# Patient Record
Sex: Male | Born: 1950 | Race: White | Hispanic: No | Marital: Married
Health system: Southern US, Community
[De-identification: ages and names within clinical notes are randomized; demographics above are authoritative.]

---

## 2011-02-11 ENCOUNTER — Ambulatory Visit: Payer: Self-pay | Admitting: Internal Medicine

## 2011-04-15 ENCOUNTER — Ambulatory Visit: Payer: Self-pay | Admitting: Gastroenterology

## 2011-04-17 LAB — PATHOLOGY REPORT

## 2012-05-27 IMAGING — US ABDOMEN ULTRASOUND
1 series · 17 of 25 positions shown · non-contrast
Comparison: none

REASON FOR EXAM: RUQ pain
COMMENTS:

PROCEDURE:     US  - US ABDOMEN GENERAL SURVEY  - February 11, 2011  [DATE]
RESULT:     Comparison: None
TECHNIQUE: Multiple gray-scale and color-flow Doppler images of the abdomen
are presented for review.

[Series 1: abdomen ultrasound · 17 of 69 slices shown]
[im 1/69]
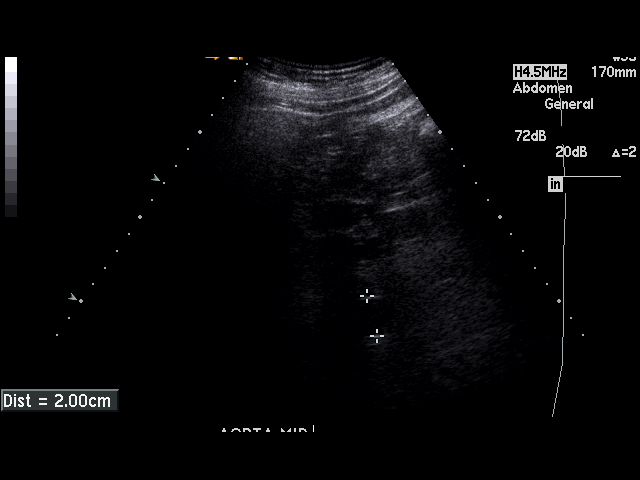
[im 6/69]
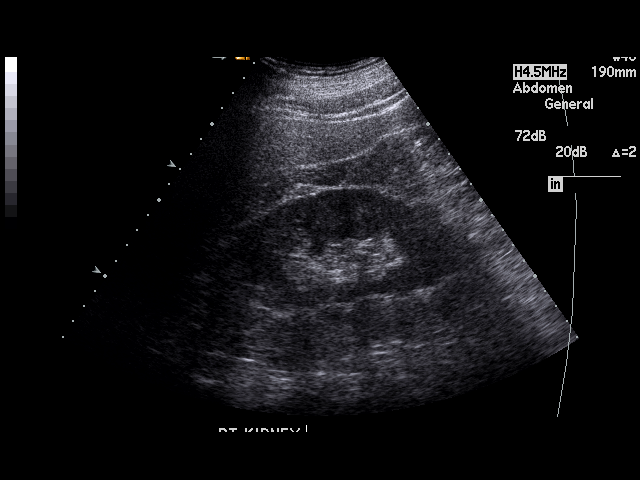
[im 9/69]
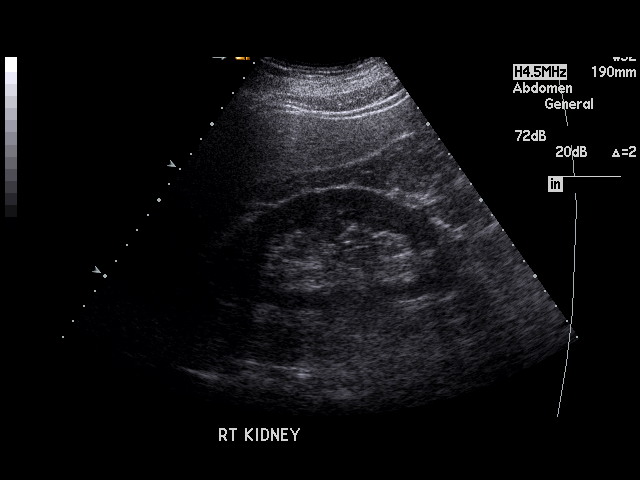
[im 15/69]
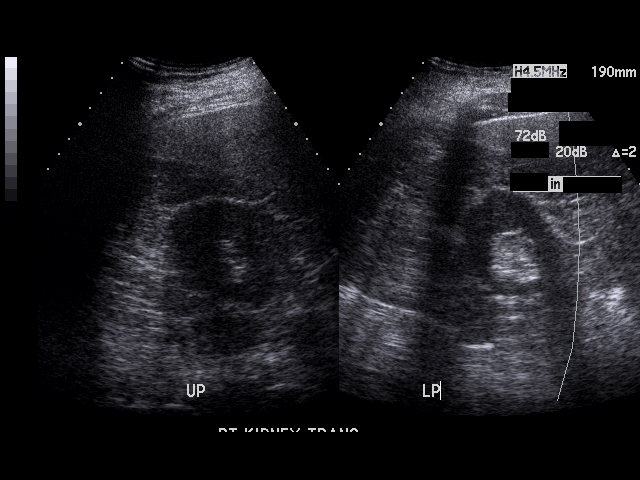
[im 18/69]
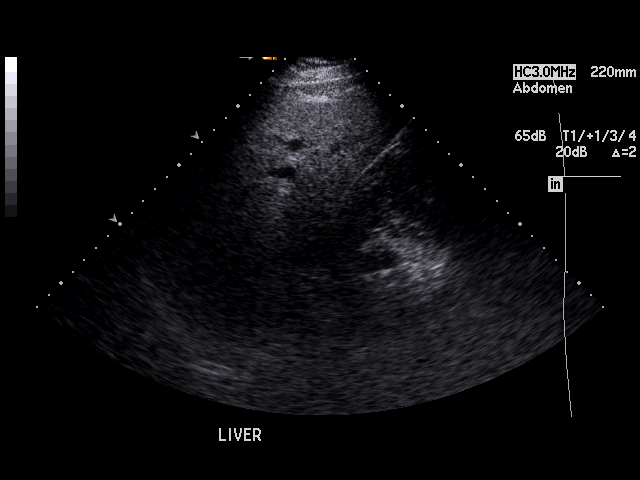
[im 23/69]
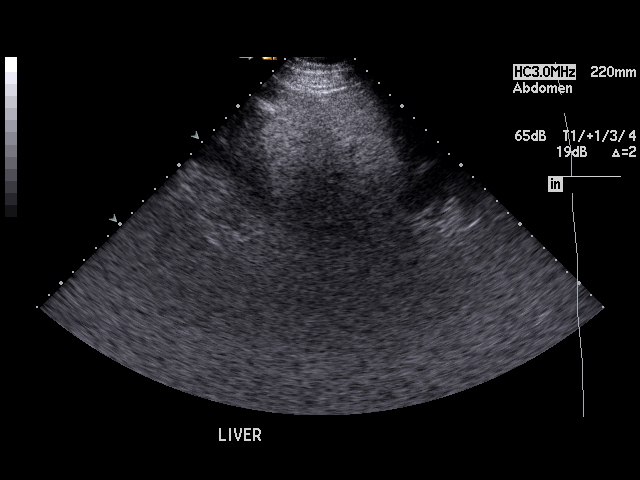
[im 26/69]
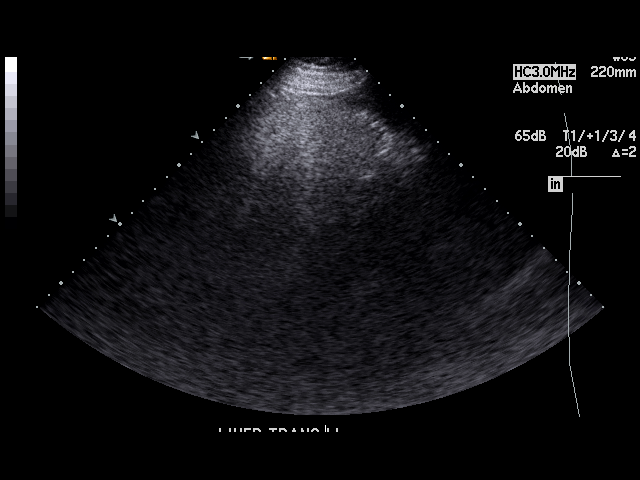
[im 32/69]
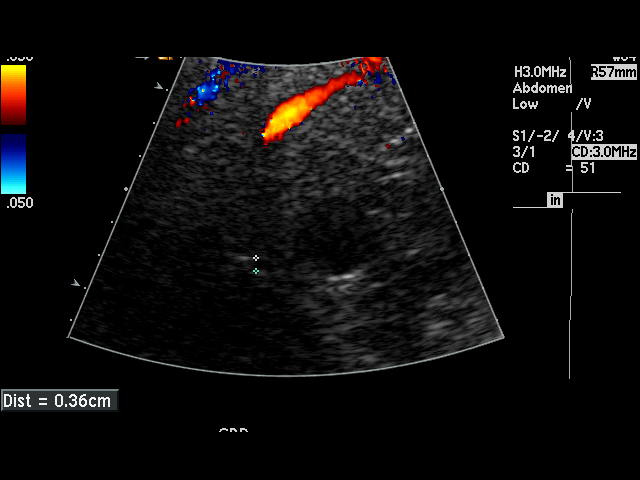
[im 35/69]
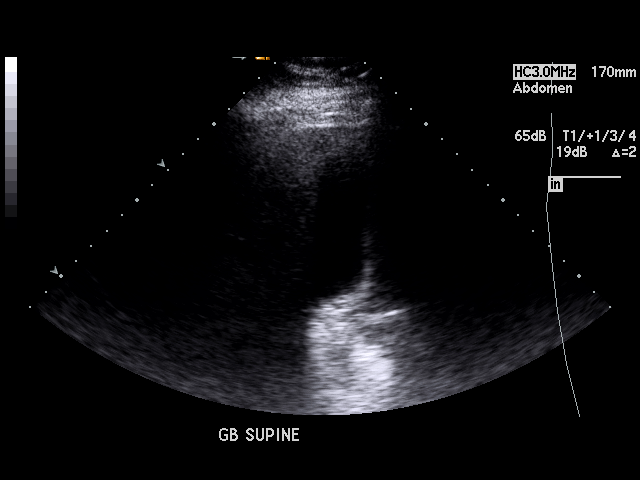
[im 37/69]
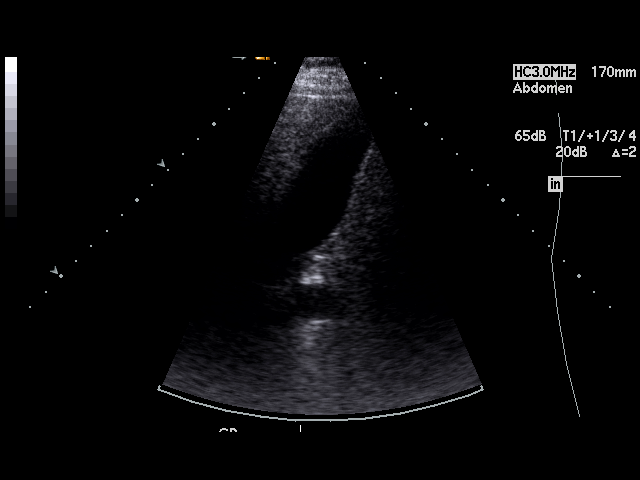
[im 43/69]
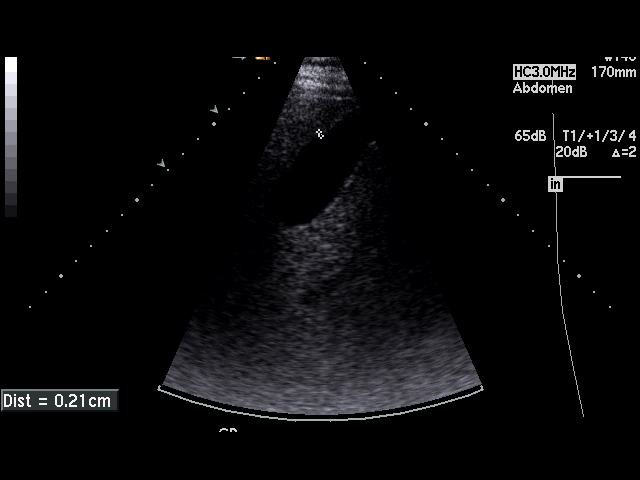
[im 46/69]
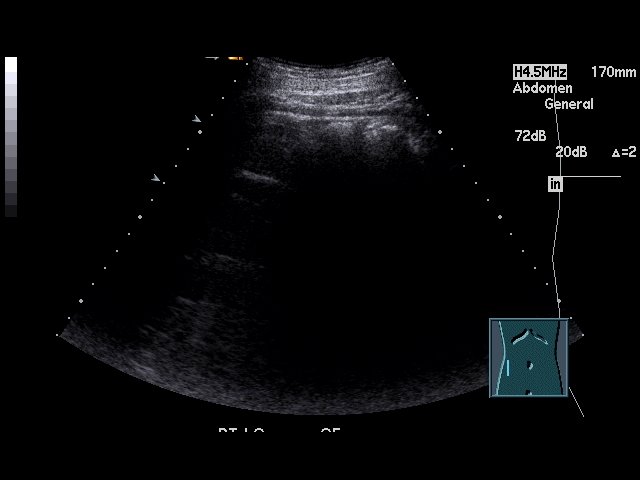
[im 52/69]
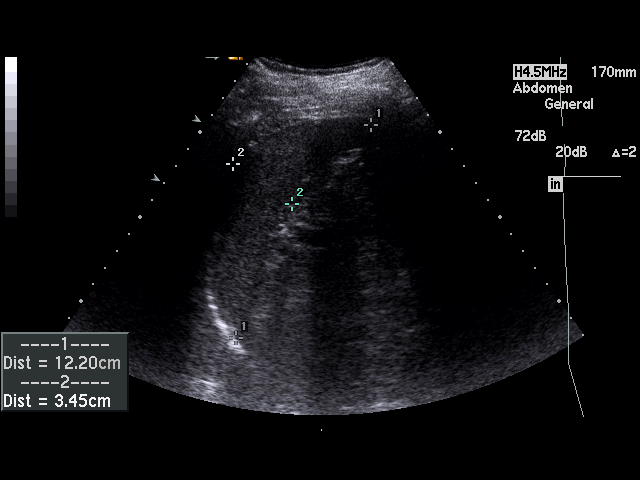
[im 54/69]
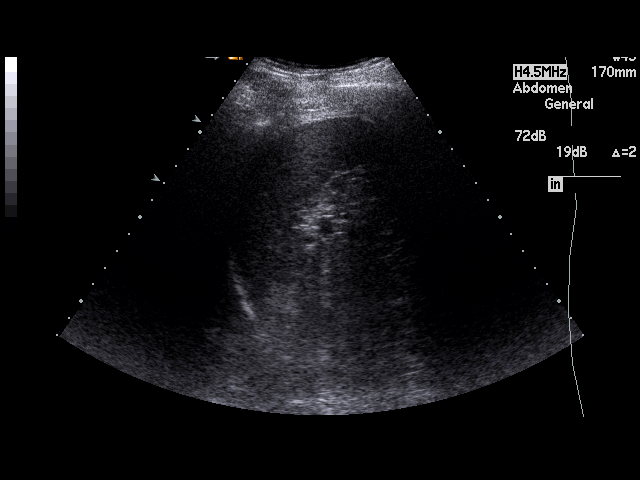
[im 60/69]
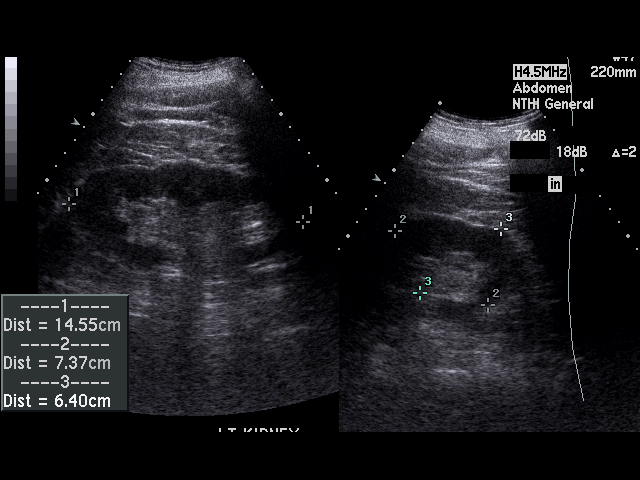
[im 63/69]
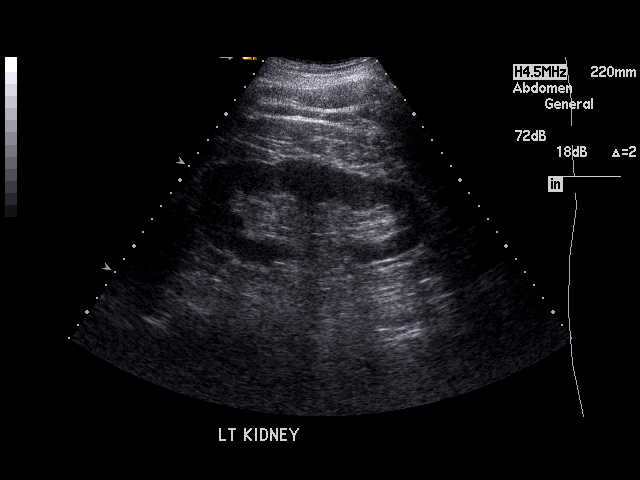
[im 69/69]
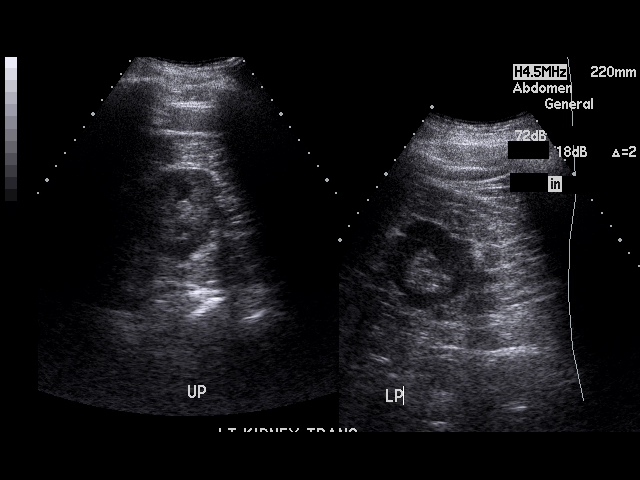

[17 of 25 positions shown; findings below may reference images not displayed]

FINDINGS: The liver is diffusely increased in echogenicity as can be seen with hepatic
steatosis. The liver is without evidence of a focal hepatic lesion.

There is no cholelithiasis or biliary sludge. There is no intra- or
extrahepatic biliary ductal dilatation. The common duct measures 3.6 mm in
maximal diameter. There is no gallbladder wall thickening, pericholecystic
fluid, or sonographic Murphy's sign.

The pancreas is not visualized secondary to overlying bowel gas. The spleen
is unremarkable. Bilateral kidneys are normal in echogenicity and size. The
right kidney measures 14.3 x 7.2 x 6.3 cm. The left kidney measures 14.6 x
7.4 x 6.4 cm. There are no renal calculi or hydronephrosis. The visualized
portions of the abdominal aorta is unremarkable. The IVC is not visualized
secondary to overlying bowel gas.
IMPRESSION: Nonvisualized pancreas secondary to overlying bowel gas.

Hepatic steatosis.

No cholelithiasis or sonographic evidence of acute cholecystitis.

## 2012-07-30 ENCOUNTER — Observation Stay: Payer: Self-pay | Admitting: Student

## 2012-07-30 LAB — APTT: Activated PTT: 91.9 secs — ABNORMAL HIGH (ref 23.6–35.9)

## 2012-07-30 LAB — COMPREHENSIVE METABOLIC PANEL
Albumin: 4.3 g/dL (ref 3.4–5.0)
Anion Gap: 11 (ref 7–16)
BUN: 12 mg/dL (ref 7–18)
Calcium, Total: 9.3 mg/dL (ref 8.5–10.1)
Co2: 23 mmol/L (ref 21–32)
EGFR (African American): >60
EGFR (Non-African Amer.): >60
Glucose: 107 mg/dL — ABNORMAL HIGH (ref 65–99)
Potassium: 4.4 mmol/L (ref 3.5–5.1)
SGOT(AST): 28 U/L (ref 15–37)
SGPT (ALT): 37 U/L (ref 12–78)
Sodium: 139 mmol/L (ref 136–145)

## 2012-07-30 LAB — PROTIME-INR
INR: 1
Prothrombin Time: 13.9 secs (ref 11.5–14.7)

## 2012-07-30 LAB — TROPONIN I: Troponin-I: <0.02 ng/mL

## 2012-07-30 LAB — CBC
HCT: 45.2 % (ref 40.0–52.0)
HGB: 15.8 g/dL (ref 13.0–18.0)
MCH: 33.3 pg (ref 26.0–34.0)
MCV: 95 fL (ref 80–100)
RDW: 13.3 % (ref 11.5–14.5)
WBC: 6.5 10*3/uL (ref 3.8–10.6)

## 2012-07-30 LAB — CK TOTAL AND CKMB (NOT AT ARMC)
CK, Total: 142 U/L (ref 35–232)
CK-MB: 3.9 ng/mL — ABNORMAL HIGH (ref 0.5–3.6)

## 2012-07-31 LAB — BASIC METABOLIC PANEL
BUN: 13 mg/dL (ref 7–18)
Chloride: 106 mmol/L (ref 98–107)
Creatinine: 0.88 mg/dL (ref 0.60–1.30)
EGFR (African American): >60
EGFR (Non-African Amer.): >60
Glucose: 164 mg/dL — ABNORMAL HIGH (ref 65–99)
Osmolality: 285 (ref 275–301)
Potassium: 4 mmol/L (ref 3.5–5.1)
Sodium: 141 mmol/L (ref 136–145)

## 2012-07-31 LAB — CK TOTAL AND CKMB (NOT AT ARMC)
CK, Total: 111 U/L (ref 35–232)
CK-MB: 2.9 ng/mL (ref 0.5–3.6)

## 2012-07-31 LAB — CBC WITH DIFFERENTIAL/PLATELET
Basophil #: 0 10*3/uL (ref 0.0–0.1)
Eosinophil #: 0.1 10*3/uL (ref 0.0–0.7)
HCT: 42.7 % (ref 40.0–52.0)
HGB: 15.1 g/dL (ref 13.0–18.0)
Lymphocyte #: 1.8 10*3/uL (ref 1.0–3.6)
Lymphocyte %: 29.1 %
MCH: 33.7 pg (ref 26.0–34.0)
MCHC: 35.3 g/dL (ref 32.0–36.0)
MCV: 96 fL (ref 80–100)
Neutrophil #: 3.9 10*3/uL (ref 1.4–6.5)
RDW: 13.4 % (ref 11.5–14.5)

## 2012-07-31 LAB — MAGNESIUM: Magnesium: 1.8 mg/dL

## 2012-07-31 LAB — HEMOGLOBIN A1C: Hemoglobin A1C: 7.1 % — ABNORMAL HIGH (ref 4.2–6.3)

## 2012-07-31 LAB — TROPONIN I: Troponin-I: <0.02 ng/mL

## 2012-07-31 LAB — LIPID PANEL
HDL Cholesterol: 32 mg/dL — ABNORMAL LOW (ref 40–60)
VLDL Cholesterol, Calc: 54 mg/dL — ABNORMAL HIGH (ref 5–40)

## 2013-11-13 IMAGING — CR DG CHEST 1V PORT
1 series · 1 of 1 positions shown · non-contrast
Comparison: none

REASON FOR EXAM: Chest Pain
COMMENTS:

PROCEDURE:     DXR - DXR PORTABLE CHEST SINGLE VIEW  - July 30, 2012 [DATE]
RESULT:     There is no previous study for comparison. Hypoinflation is
present. Cardiac silhouette is normal. Basilar atelectasis is present.

[ap]
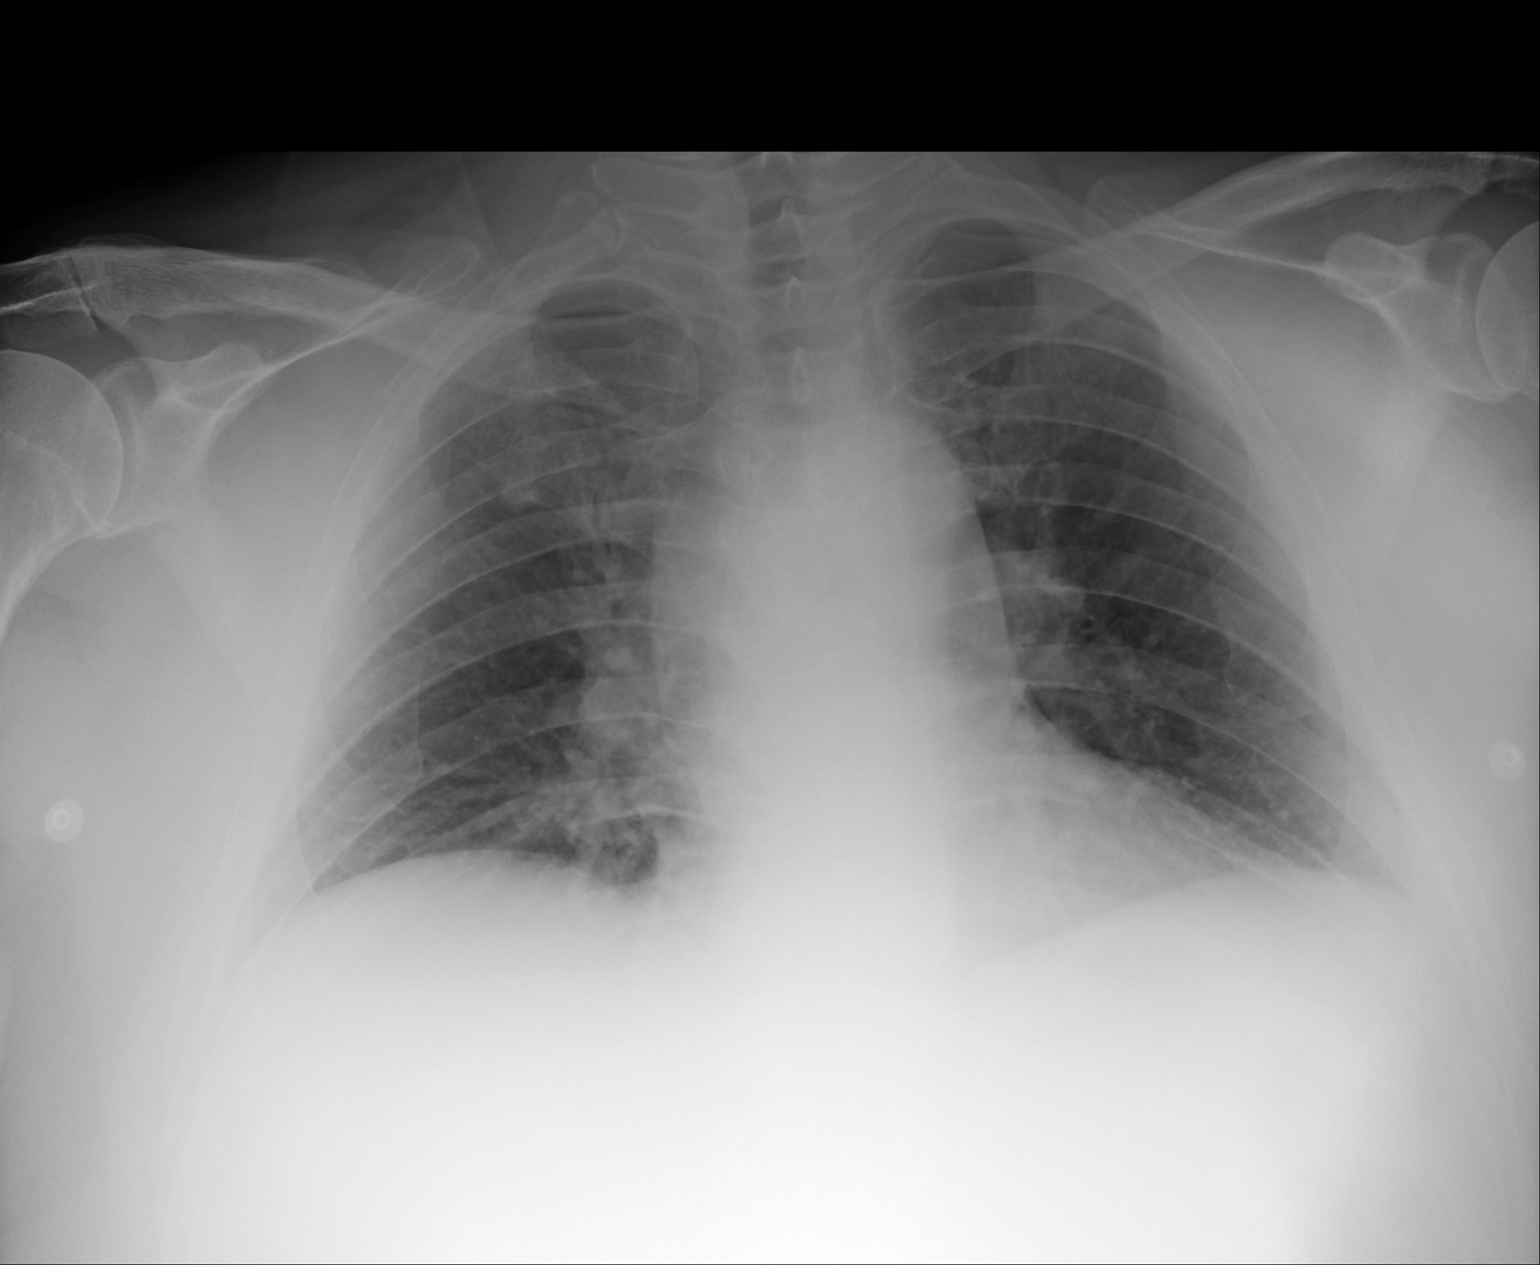

[1 of 1 positions shown; findings below may reference images not displayed]

IMPRESSION: Hypoinflation basilar atelectasis. No acute abnormality
evident.

[REDACTED]

## 2015-02-28 NOTE — Discharge Summary (Signed)
PATIENT NAME:  Alex Duran, Violet E MR#:  [id number] DATE OF BIRTH:  May 06, 1951  DATE OF ADMISSION:  07/30/2012 DATE OF DISCHARGE:  08/01/2012  REASON FOR ADMISSION: Chest pain.   HISTORY OF PRESENT ILLNESS: Please see the dictated HPI done by Dr. Jacques NavyAhmadzia on 07/30/2012.   PAST MEDICAL HISTORY:  1. Benign hypertension.  2. Type 2 diabetes.  3. Status post knee surgery.  4. Obesity.   MEDICATIONS ON ADMISSION: Please see admission note.   ALLERGIES: No known drug allergies.   SOCIAL HISTORY, FAMILY HISTORY, AND REVIEW OF SYSTEMS: As per admission note.   PHYSICAL EXAM:   GENERAL: The patient was in no acute distress.   VITAL SIGNS: Vital signs were stable, and he was afebrile.   HEENT: Unremarkable.   NECK: Supple without JVD.   LUNGS: Clear.   CARDIAC EXAM: Regular rate and rhythm. Normal S1 and S2.   ABDOMEN: Soft and nontender.   EXTREMITIES: Without edema.   NEUROLOGIC EXAM: Grossly nonfocal.   HOSPITAL COURSE: The patient was admitted with multiple risk factors and chest pain. He ruled out for an MI by serial cardiac enzymes. He was seen in consultation by Cardiology. The patient underwent stress Myoview testing on 07/31/2012 which was negative for ischemia. He had no further chest pain during his hospitalization. This morning, he is stable without complaints and is ready for discharge.   DISCHARGE DIAGNOSES:  1. Noncardiac chest pain.  2. Gastroesophageal reflux disease.  3. Presumed esophageal spasm.  4. Benign hypertension.  5. Type 2 diabetes.   DISCHARGE MEDICATIONS:  1. Aspirin 81 mg p.o. daily.  2. Omeprazole 20 mg p.o. b.i.d.  3. Imdur 30 mg p.o. daily.  4. Lopressor 25 mg p.o. b.i.d.  5. Allopurinol 300 mg p.o. daily.  6. Glyburide 5 mg p.o. daily.  7. Enalapril 40 mg p.o. daily.  8. Metformin 1000 mg p.o. daily.  9. Victoza as directed.   FOLLOWUP PLANS AND APPOINTMENTS: The patient was discharged on a low-sodium, carbohydrate-controlled diet. He  will follow up with Dr. Dareen PianoAnderson in 1 to 2 weeks, sooner if needed.   ____________________________ Duane LopeJeffrey D. Judithann SheenSparks, MD jds:vtd D: 08/01/2012 08:40:50 ET  T: 08/03/2012 13:14:46 ET  JOB#: [id number] cc: Duane LopeJeffrey D. Judithann SheenSparks, MD, <Dictator> Burle Kwan Rodena Medin Vivien Barretto MD ELECTRONICALLY SIGNED 08/03/2012 17:06

## 2015-02-28 NOTE — H&P (Signed)
PATIENT NAME:  Alex Duran, Alex Duran MR#:  [id number] DATE OF BIRTH:  Jan 14, 1951  DATE OF ADMISSION:  07/30/2012  REFERRING PHYSICIAN: Daryel November, MD    PRIMARY CARE PHYSICIAN: Einar Crow, MD  at [facility]   CHIEF COMPLAINT: Chest pain.   HISTORY OF PRESENT ILLNESS: The patient is a pleasant 64 year old Caucasian male with history of hypertension, diabetes, who presents with chest pain. The pain has been going off and on for three days. The pain could last for up to two or three hours. Today at work he did feel some radiation to the arm and then the neck area. The patient also felt cold, clammy, and had some dizziness and shortness of breath. Here he has a negative troponin, but the pain did go away somewhat with nitroglycerin. Therefore, he is asked to be evaluated and admitted by the Hospitalist Service. He denies having any history of myocardial infarction in the past. He states his sugars are usually good, and last hemoglobin A1c is in the mid 6s.   PAST MEDICAL/SURGICAL HISTORY:  1. Hypertension.  2. Diabetes.  3. Knee surgery in 1970s.   ALLERGIES: No known drug allergies.    SOCIAL HISTORY: No tobacco. Occasional alcohol. No drug use.   FAMILY HISTORY: Family history of cancers, including kidney, liver and breast.   MEDICATIONS:  1. Allopurinol 300 mg daily.  2. Enalapril 20 mg, 2 tabs once a day. 3. Glyburide 5 mg daily.  4. Metformin Extended Release 500 mg, 2 tabs once a day. 5. Victoza  18 mg/3 mL, 3 mL subcutaneous once a day in the morning.   REVIEW OF SYSTEMS: CONSTITUTIONAL: No fever or fatigue, positive for about a 40-pounds weight loss after starting Victoza with decrease in appetite and nausea but now improved. EYES: Blurry vision when sugars are high. ENT: No tinnitus or hearing loss. RESPIRATORY: No cough, wheezing, or chronic obstructive pulmonary disease. No asthma. CARDIOVASCULAR: No orthopnea or edema. Chest pain as above. No palpitations. Positive  history of high blood pressure. GI: No nausea, vomiting, abdominal pain. No rectal bleeding or dark stools. GENITOURINARY: Denies dysuria or nocturia. HEME/LYMPH: Endorses positive lymph nodes in bilateral upper extremities for two years. He states his PCP is aware and it is stable. ENDOCRINE: Denies polyuria or nocturia. SKIN: No new rashes. MUSCULOSKELETAL: Denies arthritis. NEUROLOGICAL: Has neuropathy in his feet. PSYCHIATRIC: No anxiety or insomnia.   PHYSICAL EXAMINATION:  VITAL SIGNS: Temperature 95.6, pulse 86, respiratory rate 20, blood pressure on arrival 154/123, oxygen saturation 98% on room air.   GENERAL: The patient is an obese Caucasian male sitting in bed in no obvious distress, talking in full sentences.   HEENT: Normocephalic, atraumatic. Pupils are equal, round and reactive to light and accommodation. Anicteric sclerae. Moist mucous membranes.   NECK: Supple. No JVD. No thyroid tenderness.   CARDIOVASCULAR: S1, S2, regular rate and rhythm. No murmurs, rubs, or gallops.   LUNGS: Clear to auscultation. No wheezing or rhonchi.   ABDOMEN: Soft, nontender, nondistended. Positive bowel sounds.   EXTREMITIES: No significant lower extremity edema.   NEUROLOGICAL: Cranial nerves II through XII are grossly intact. Strength 5 out of 5 in all extremities.   PSYCHIATRIC: Awake, alert, oriented x3, pleasant and cooperative.   SKIN: No obvious rashes.  LABORATORY, DIAGNOSTIC AND RADIOLOGICAL DATA: Glucose 107, creatinine 0.76, sodium 139, potassium 4.4. LFTs are within normal limits. Initial CK-MB 3.9, troponin negative. WBC 6.5, hemoglobin 15.8, hematocrit 45.2, platelets 165. INR 1.0.  EKG: Normal sinus rhythm, anterior  infarct per EKG. There are some Q waves in V2 and III and aVF. T wave inversions in II, I and V3. No acute ST elevations or depressions. X-ray of the chest showing hypoinflation, basilar atelectasis, no acute abnormality, however.   ASSESSMENT AND PLAN: We have a  pleasant 64 year old male with a history of hypertension, diabetes, who presents with three days of off and on chest pains with very typical features and concerning for unstable angina. Would admit the patient for observation and admit him on telemetry and rule out acute myocardial infarction. Would cycle the troponins and order an echocardiogram, and if patient does rule out would order a stress test which can be done tomorrow. Would also obtain Cardiology consult. The patient did have some improvement with nitroglycerin. At this point, would continue nitroglycerin p.r.n. and also a patch in addition to a beta blocker and initiation of heparin drip as he is a high risk patient, given the above. Would also check a lipid profile and consider statin if the lipids are abnormal as he does have hypertension and diabetes for risk modification. In regards to his high blood pressure, would continue the ACE and also start him on metoprolol 25 mg b.i.d. for now and check his blood pressures while he is in the hospital. Would hold his oral blood glucose medications and start him on sliding scale insulin and check a hemoglobin A1c.   CODE STATUS:  The patient is a FULL CODE.    TOTAL TIME SPENT: 45 minutes.     ____________________________ Krystal EatonShayiq Tyffani Foglesong, MD sa:cbb D: 07/30/2012 15:37:11 ET T: 07/30/2012 16:04:49 ET JOB#: [id number]  cc: Krystal EatonShayiq Orlondo Holycross, MD, <Dictator> Marya AmslerMarshall W. Dareen PianoAnderson, MD Krystal EatonSHAYIQ Adaleah Forget MD ELECTRONICALLY SIGNED 08/09/2012 17:45

## 2015-02-28 NOTE — Consult Note (Signed)
PATIENT NAME:  Alex Duran, Alex Duran MR#:  [id number] DATE OF BIRTH:  [birth year]  DATE OF CONSULTATION:  07/31/2012  REFERRING PHYSICIAN:   CONSULTING PHYSICIAN:  Marcina MillardAlexander Aly Seidenberg, MD  PRIMARY CARE PHYSICIAN:  Dr. Dareen PianoAnderson  CHIEF COMPLAINT: Chest pain.   REASON FOR CONSULTATION: Consultation requested for evaluation of chest pain.   HISTORY OF PRESENT ILLNESS: The patient is a 64 year old gentleman with multiple cardiovascular risk factors including hypertension and diabetes, referred for evaluation of chest pain. The patient reports a three-day history of intermittent episodes of chest discomfort. These episodes typically would last up to 2-3 hours. The patient felt some mild radiation to his left arm and neck. [facility] where EKG was nondiagnostic. The patient was admitted to telemetry where he ruled out for myocardial infarction by CPK isoenzymes and troponin.   PAST MEDICAL HISTORY:  1. Hypertension.  2. Diabetes.   MEDICATIONS:  1. Enalapril 20 mg daily.  2. Allopurinol 300 mg daily.  3. Glyburide 5 mg daily.  4. Metformin 500 mg 2 tabs daily.  5. Victoza 18 mg per 3 mL, 3 mL subcutaneous daily.   SOCIAL HISTORY: The patient denies tobacco or EtOH abuse.   FAMILY HISTORY: No immediate family history of coronary artery disease or myocardial infarction.   REVIEW OF SYSTEMS: CONSTITUTIONAL: No fever or chills. EYES: No blurry vision.  EARS: No hearing loss. RESPIRATORY: No shortness of breath. CARDIOVASCULAR: Chest pain as described above. GASTROINTESTINAL: No nausea, vomiting, diarrhea, or constipation. GU: No dysuria or hematuria. ENDOCRINE: No polyuria or polydipsia. INTEGUMENT: No rash. MUSCULOSKELETAL: No arthralgias or myalgias. NEUROLOGIC: No focal muscle weakness or numbness. PSYCHOLOGIC: No depression or anxiety.   PHYSICAL EXAMINATION:  VITAL SIGNS: Blood pressure 143/83, pulse 78, respirations 20, temperature 98.1, pulse oximetry 95%.   HEENT:  Pupils equal and reactive to light and accommodation.   NECK: Supple without thyromegaly.   LUNGS: Clear.   HEART: Normal jugular venous pressure. Normal point of maximal impulse. Regular rate and rhythm. Normal S1, S2. No appreciable gallop, murmur, or rub.   ABDOMEN: Soft and nontender. Pulses were intact bilaterally.   MUSCULOSKELETAL: Normal muscle tone.   NEUROLOGIC: The patient is alert and oriented times three. Motor and sensory both grossly intact.   IMPRESSION: This is a 64 year old gentleman with three-day history of intermittent episodes of chest pain who has ruled out for myocardial infarction by CPK isoenzymes and troponin.   RECOMMENDATIONS:  1. Agree with current therapy.  2. Continue heparin drip for now.  3. Proceed with LexiScan sestamibi as planned.  4. Review 2-D echocardiogram.      5. Further recommendations pending LexiScan sestamibi results.     ____________________________ Marcina MillardAlexander Avamarie Crossley, MD ap:bjt D: 07/31/2012 13:03:40 ET T: 07/31/2012 13:32:04 ET JOB#: [id number]  cc: Marcina MillardAlexander Kameren Pargas, MD, <Dictator> Marcina MillardALEXANDER Thaison Kolodziejski MD ELECTRONICALLY SIGNED 08/02/2012 13:52

## 2015-02-28 NOTE — Consult Note (Signed)
Brief Consult Note: Diagnosis: CP. neg trop.   Patient was seen by consultant.   Consult note dictated.   Comments: REC  Agree with curren therapy, cont hep drip for now, review Lexi-sest and echo.  Electronic Signatures: Marcina MillardParaschos, Mckinsley Koelzer (MD)  (Signed 20-Sep-13 13:04)  Authored: Brief Consult Note   Last Updated: 20-Sep-13 13:04 by Marcina MillardParaschos, Lilliane Sposito (MD)
